# Patient Record
Sex: Female | Born: 1950 | Race: White | Hispanic: No | Marital: Married | Smoking: Current every day smoker
Health system: Southern US, Community
[De-identification: ages and names within clinical notes are randomized; demographics above are authoritative.]

## PROBLEM LIST (undated history)

## (undated) DIAGNOSIS — M8580 Other specified disorders of bone density and structure, unspecified site: Secondary | ICD-10-CM

## (undated) DIAGNOSIS — E039 Hypothyroidism, unspecified: Secondary | ICD-10-CM

## (undated) DIAGNOSIS — E785 Hyperlipidemia, unspecified: Secondary | ICD-10-CM

## (undated) DIAGNOSIS — J449 Chronic obstructive pulmonary disease, unspecified: Secondary | ICD-10-CM

## (undated) DIAGNOSIS — H269 Unspecified cataract: Secondary | ICD-10-CM

## (undated) DIAGNOSIS — Z86010 Personal history of colonic polyps: Secondary | ICD-10-CM

## (undated) HISTORY — DX: Other specified disorders of bone density and structure, unspecified site: M85.80

## (undated) HISTORY — DX: Hyperlipidemia, unspecified: E78.5

## (undated) HISTORY — DX: Hypothyroidism, unspecified: E03.9

## (undated) HISTORY — DX: Chronic obstructive pulmonary disease, unspecified: J44.9

## (undated) HISTORY — DX: Personal history of colonic polyps: Z86.010

## (undated) HISTORY — PX: WISDOM TOOTH EXTRACTION: SHX21

## (undated) HISTORY — DX: Unspecified cataract: H26.9

---

## 1972-02-11 HISTORY — PX: APPENDECTOMY: SHX54

## 1974-02-10 HISTORY — PX: BREAST CYST EXCISION: SHX579

## 1998-12-25 ENCOUNTER — Encounter: Admission: RE | Admit: 1998-12-25 | Discharge: 1998-12-25 | Payer: Self-pay | Admitting: Family Medicine

## 1998-12-25 ENCOUNTER — Encounter: Payer: Self-pay | Admitting: Family Medicine

## 1999-01-16 ENCOUNTER — Other Ambulatory Visit: Admission: RE | Admit: 1999-01-16 | Discharge: 1999-01-16 | Payer: Self-pay | Admitting: Obstetrics and Gynecology

## 2000-03-13 ENCOUNTER — Ambulatory Visit (HOSPITAL_COMMUNITY): Admission: RE | Admit: 2000-03-13 | Discharge: 2000-03-13 | Payer: Self-pay | Admitting: Internal Medicine

## 2000-03-13 ENCOUNTER — Encounter: Payer: Self-pay | Admitting: Internal Medicine

## 2000-03-17 ENCOUNTER — Other Ambulatory Visit: Admission: RE | Admit: 2000-03-17 | Discharge: 2000-03-17 | Payer: Self-pay | Admitting: Obstetrics and Gynecology

## 2000-06-17 ENCOUNTER — Encounter: Admission: RE | Admit: 2000-06-17 | Discharge: 2000-06-17 | Payer: Self-pay | Admitting: Obstetrics and Gynecology

## 2000-06-17 ENCOUNTER — Encounter: Payer: Self-pay | Admitting: Obstetrics and Gynecology

## 2001-10-20 ENCOUNTER — Other Ambulatory Visit: Admission: RE | Admit: 2001-10-20 | Discharge: 2001-10-20 | Payer: Self-pay | Admitting: Obstetrics and Gynecology

## 2002-02-10 DIAGNOSIS — E039 Hypothyroidism, unspecified: Secondary | ICD-10-CM

## 2002-02-10 HISTORY — DX: Hypothyroidism, unspecified: E03.9

## 2002-04-18 ENCOUNTER — Ambulatory Visit (HOSPITAL_COMMUNITY): Admission: RE | Admit: 2002-04-18 | Discharge: 2002-04-18 | Payer: Self-pay | Admitting: Gastroenterology

## 2003-01-11 ENCOUNTER — Other Ambulatory Visit: Admission: RE | Admit: 2003-01-11 | Discharge: 2003-01-11 | Payer: Self-pay | Admitting: Obstetrics and Gynecology

## 2003-07-20 ENCOUNTER — Encounter: Admission: RE | Admit: 2003-07-20 | Discharge: 2003-07-20 | Payer: Self-pay | Admitting: Obstetrics and Gynecology

## 2004-03-14 ENCOUNTER — Ambulatory Visit: Payer: Self-pay | Admitting: Endocrinology

## 2004-04-11 ENCOUNTER — Ambulatory Visit: Payer: Self-pay | Admitting: Endocrinology

## 2004-06-17 ENCOUNTER — Ambulatory Visit: Payer: Self-pay | Admitting: Endocrinology

## 2004-07-31 ENCOUNTER — Other Ambulatory Visit: Admission: RE | Admit: 2004-07-31 | Discharge: 2004-07-31 | Payer: Self-pay | Admitting: Obstetrics and Gynecology

## 2005-05-14 ENCOUNTER — Encounter: Admission: RE | Admit: 2005-05-14 | Discharge: 2005-05-14 | Payer: Self-pay | Admitting: Obstetrics and Gynecology

## 2005-09-26 ENCOUNTER — Other Ambulatory Visit: Admission: RE | Admit: 2005-09-26 | Discharge: 2005-09-26 | Payer: Self-pay | Admitting: Obstetrics and Gynecology

## 2006-05-27 ENCOUNTER — Encounter: Admission: RE | Admit: 2006-05-27 | Discharge: 2006-05-27 | Payer: Self-pay | Admitting: Obstetrics and Gynecology

## 2007-05-28 ENCOUNTER — Encounter: Admission: RE | Admit: 2007-05-28 | Discharge: 2007-05-28 | Payer: Self-pay | Admitting: Obstetrics and Gynecology

## 2008-06-05 ENCOUNTER — Encounter: Admission: RE | Admit: 2008-06-05 | Discharge: 2008-06-05 | Payer: Self-pay | Admitting: Obstetrics and Gynecology

## 2009-06-07 ENCOUNTER — Encounter: Admission: RE | Admit: 2009-06-07 | Discharge: 2009-06-07 | Payer: Self-pay | Admitting: Obstetrics and Gynecology

## 2010-06-28 NOTE — Op Note (Signed)
NAME:  Mindy Jones, Mindy Jones                         ACCOUNT NO.:  [id number]   MEDICAL RECORD NO.:  [id number]                   PATIENT TYPE:  AMB   LOCATION:  ENDO                                 FACILITY:  [facility]   PHYSICIAN:  Anselmo Rod, M.D.               DATE OF BIRTH:  [birth year]   DATE OF PROCEDURE:  04/18/2002  DATE OF DISCHARGE:                                 OPERATIVE REPORT   PROCEDURE:  Screening colonoscopy.   ENDOSCOPIST:  Anselmo Rod, M.D.   INSTRUMENT USED:  Pediatric adjustable Olympus colonoscope.   INDICATION FOR PROCEDURE:  A 60 year old white female with a family history  of colon cancer in maternal grandmother and change in bowel habit,  undergoing screening colonoscopy.  Rule out colonic polyps, masses, etc.   PREPROCEDURE PREPARATION:  Informed consent was procured from the patient.  The patient had fasted for eight hours prior to the procedure and prepped  with a bottle of Miralax and Gatorade the night prior to the procedure.   PREPROCEDURE PHYSICAL:  VITAL SIGNS:  The patient had stable vital signs.  NECK:  Supple.  CHEST:  Clear to auscultation.  S1, S2 regular.  ABDOMEN:  Soft with normal bowel sounds.   DESCRIPTION OF PROCEDURE:  The patient was placed in the left lateral  decubitus position and sedated with 70 mg of Demerol and 6 mg of Versed  intravenously.  Once the patient was adequately sedate and maintained on low-  flow oxygen and continuous cardiac monitoring, the Olympus video colonoscope  was advanced from the rectum to the cecum with slight difficulty.  Abdominal  pressure was applied to facilitate the entry of the scope into the cecum.  The appendiceal orifice and the ileocecal valve were clearly visualized and  photographed.  No masses, polyps, erosions, or ulcerations were seen.  There  was no evidence of diverticulosis.  Small, nonbleeding internal hemorrhoids  were seen on retroflexion in the rectum.  The patient tolerated  the  procedure well without complication.   IMPRESSION:  1. Normal colonoscopy up to the cecum except for small, nonbleeding internal     hemorrhoid.  2. Normal-appearing terminal ileum.  3. No masses, polyps, or diverticulosis noted.   RECOMMENDATIONS:  1. A high-fiber diet with liberal fluid intake has been advocated.  2.     A repeat colorectal cancer screening is recommended in the next five years     unless the patient develops any abnormal symptoms in the interim.  3. Outpatient follow-up in the next two weeks for further recommendations.                                               Anselmo Rod, M.D.    JNM/MEDQ  D:  04/18/2002  T:  04/18/2002  Job:  [id number]   cc:   Dois Davenport A. Rivard, M.D.  [address]  [place]  [place] [address]  Fax: [phone]

## 2010-07-02 ENCOUNTER — Other Ambulatory Visit: Payer: Self-pay | Admitting: Obstetrics and Gynecology

## 2010-07-02 DIAGNOSIS — Z1231 Encounter for screening mammogram for malignant neoplasm of breast: Secondary | ICD-10-CM

## 2010-07-05 ENCOUNTER — Ambulatory Visit
Admission: RE | Admit: 2010-07-05 | Discharge: 2010-07-05 | Disposition: A | Discharge status: Home / Self Care | Payer: BC Managed Care – PPO | Source: Ambulatory Visit | Attending: Obstetrics and Gynecology | Admitting: Obstetrics and Gynecology

## 2010-07-05 DIAGNOSIS — Z1231 Encounter for screening mammogram for malignant neoplasm of breast: Secondary | ICD-10-CM

## 2011-03-27 ENCOUNTER — Ambulatory Visit
Admission: RE | Admit: 2011-03-27 | Discharge: 2011-03-27 | Disposition: A | Discharge status: Home / Self Care | Payer: BC Managed Care – PPO | Source: Ambulatory Visit | Attending: Family Medicine | Admitting: Family Medicine

## 2011-03-27 ENCOUNTER — Other Ambulatory Visit: Payer: Self-pay | Admitting: Family Medicine

## 2011-03-27 DIAGNOSIS — M79602 Pain in left arm: Secondary | ICD-10-CM

## 2011-04-30 ENCOUNTER — Ambulatory Visit: Payer: Self-pay | Admitting: Obstetrics and Gynecology

## 2011-06-19 DIAGNOSIS — E0500 Thyrotoxicosis with diffuse goiter without thyrotoxic crisis or storm: Secondary | ICD-10-CM | POA: Insufficient documentation

## 2011-06-19 DIAGNOSIS — M8580 Other specified disorders of bone density and structure, unspecified site: Secondary | ICD-10-CM | POA: Insufficient documentation

## 2011-06-26 ENCOUNTER — Ambulatory Visit: Payer: Self-pay | Admitting: Obstetrics and Gynecology

## 2011-07-02 ENCOUNTER — Encounter: Payer: Self-pay | Admitting: Obstetrics and Gynecology

## 2011-07-02 ENCOUNTER — Ambulatory Visit (INDEPENDENT_AMBULATORY_CARE_PROVIDER_SITE_OTHER): Payer: BC Managed Care – PPO | Admitting: Obstetrics and Gynecology

## 2011-07-02 VITALS — BP 108/60 | Resp 16 | Ht 65.0 in | Wt 173.0 lb

## 2011-07-02 DIAGNOSIS — Z01419 Encounter for gynecological examination (general) (routine) without abnormal findings: Secondary | ICD-10-CM

## 2011-07-02 DIAGNOSIS — Z124 Encounter for screening for malignant neoplasm of cervix: Secondary | ICD-10-CM

## 2011-07-02 NOTE — Progress Notes (Signed)
The patient is not taking hormone replacement therapy The patient  is not taking a Calcium supplement. Post-menopausal bleeding:no  Last Pap: approximate date 04/18/2010 and was normal March  2012 Last mammogram: approximate date 07/05/2010 and was normal May  2012 Last DEXA scan : T= -1.7     04/18/2010 Last colonoscopy:was abnormal:  Hemorrhoids, Polyps April 2009 Last TSH 1 year ago and normal. Seeing PCP next week.  Urinary symptoms: none Normal bowel movements: No:  Reports abuse at home: No:   Subjective:    Mindy Jones is a 61 y.o. female G4P2 who presents for annual exam.  The patient has no complaints today.   The following portions of the patient's history were reviewed and updated as appropriate: allergies, current medications, past family history, past medical history, past social history, past surgical history and problem list.  Review of Systems Pertinent items are noted in HPI. Gastrointestinal:No change in bowel habits, no abdominal pain, no rectal bleeding Genitourinary:negative for dysuria, frequency, hematuria, nocturia and urinary incontinence    Objective:     BP 108/60  Resp 16  Ht 5\' 5"  (1.651 m)  Wt 173 lb (78.472 kg)  BMI 28.79 kg/m2  Weight:  Wt Readings from Last 1 Encounters:  07/02/11 173 lb (78.472 kg)     BMI: Body mass index is 28.79 kg/(m^2). General Appearance: Alert, appropriate appearance for age. No acute distress HEENT: Grossly normal Neck / Thyroid: Supple, no masses, nodes or enlargement Lungs: clear to auscultation bilaterally Back: No CVA tenderness Breast Exam: No masses or nodes.No dimpling, nipple retraction or discharge. Cardiovascular: Regular rate and rhythm. S1, S2, no murmur Gastrointestinal: Soft, non-tender, no masses or organomegaly Pelvic Exam: Vulva and vagina appear normal. Bimanual exam reveals normal uterus and adnexa. Rectovaginal: normal rectal, no masses Lymphatic Exam: Non-palpable nodes in neck, clavicular,  axillary, or inguinal regions Skin: no rash or abnormalities Neurologic: Normal gait and speech, no tremor  Psychiatric: Alert and oriented, appropriate affect.    Urinalysis:Not done      Assessment:    Normal gyn exam    Plan:    Mammogram. Pap smear.   Follow-up:  for annual exam

## 2011-10-21 ENCOUNTER — Other Ambulatory Visit: Payer: Self-pay | Admitting: Obstetrics and Gynecology

## 2011-10-21 DIAGNOSIS — Z1231 Encounter for screening mammogram for malignant neoplasm of breast: Secondary | ICD-10-CM

## 2011-11-03 ENCOUNTER — Ambulatory Visit
Admission: RE | Admit: 2011-11-03 | Discharge: 2011-11-03 | Disposition: A | Discharge status: Home / Self Care | Payer: BC Managed Care – PPO | Source: Ambulatory Visit | Attending: Obstetrics and Gynecology | Admitting: Obstetrics and Gynecology

## 2011-11-03 DIAGNOSIS — Z1231 Encounter for screening mammogram for malignant neoplasm of breast: Secondary | ICD-10-CM

## 2012-06-23 ENCOUNTER — Other Ambulatory Visit: Payer: Self-pay | Admitting: Family Medicine

## 2012-06-23 DIAGNOSIS — M8580 Other specified disorders of bone density and structure, unspecified site: Secondary | ICD-10-CM

## 2012-07-01 ENCOUNTER — Ambulatory Visit
Admission: RE | Admit: 2012-07-01 | Discharge: 2012-07-01 | Disposition: A | Discharge status: Home / Self Care | Payer: BC Managed Care – PPO | Source: Ambulatory Visit | Attending: Family Medicine | Admitting: Family Medicine

## 2012-07-01 DIAGNOSIS — M8580 Other specified disorders of bone density and structure, unspecified site: Secondary | ICD-10-CM

## 2012-12-31 ENCOUNTER — Other Ambulatory Visit: Payer: Self-pay | Admitting: Family Medicine

## 2012-12-31 DIAGNOSIS — F17200 Nicotine dependence, unspecified, uncomplicated: Secondary | ICD-10-CM

## 2012-12-31 DIAGNOSIS — Z1231 Encounter for screening mammogram for malignant neoplasm of breast: Secondary | ICD-10-CM

## 2013-01-05 IMAGING — CR DG CERVICAL SPINE COMPLETE 4+V
5 series · 5 of 5 positions shown · non-contrast
Comparison: None.

CLINICAL DATA: Left arm and hand pain.  Limited range of motion of
the neck.

CERVICAL SPINE - COMPLETE 4+ VIEW

[view not recorded (1 of 5)]
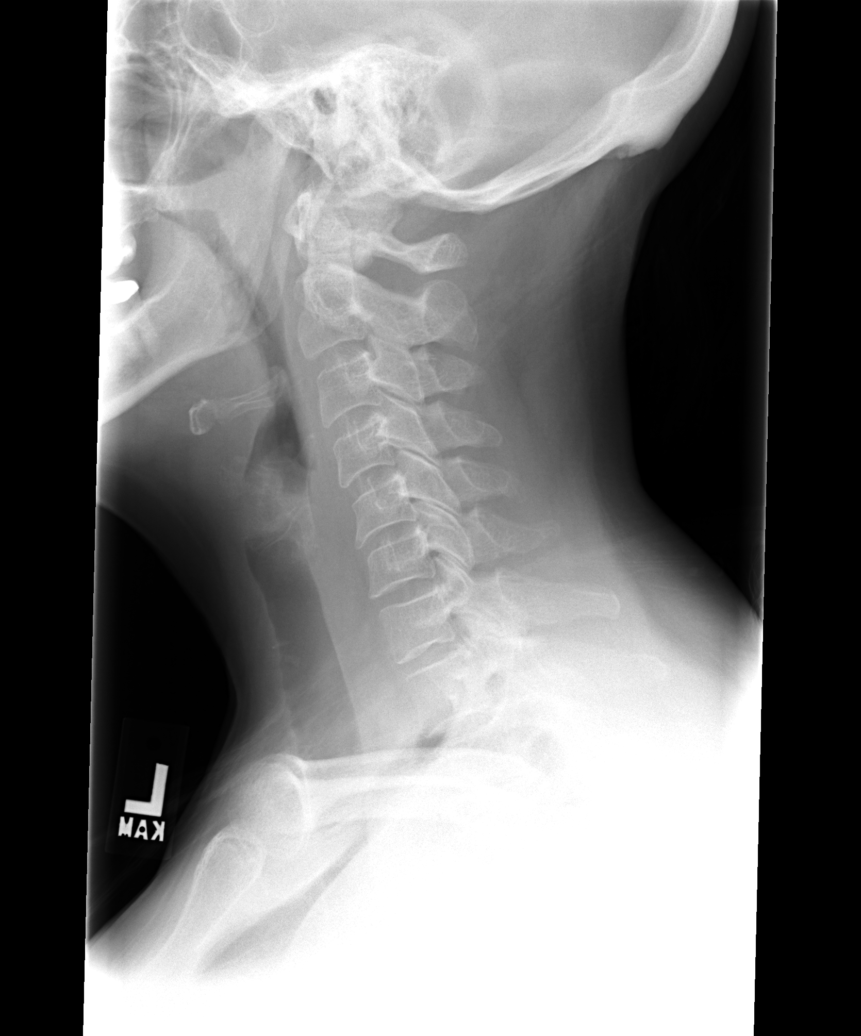

[view not recorded (2 of 5)]
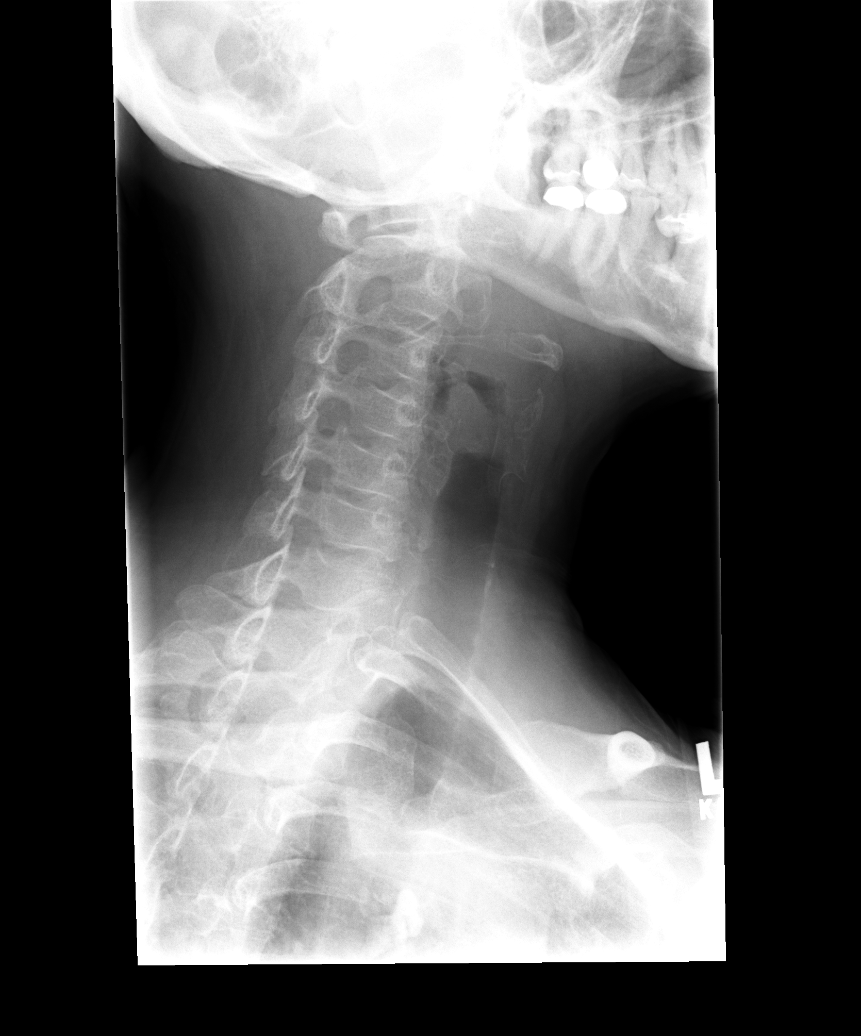

[view not recorded (3 of 5)]
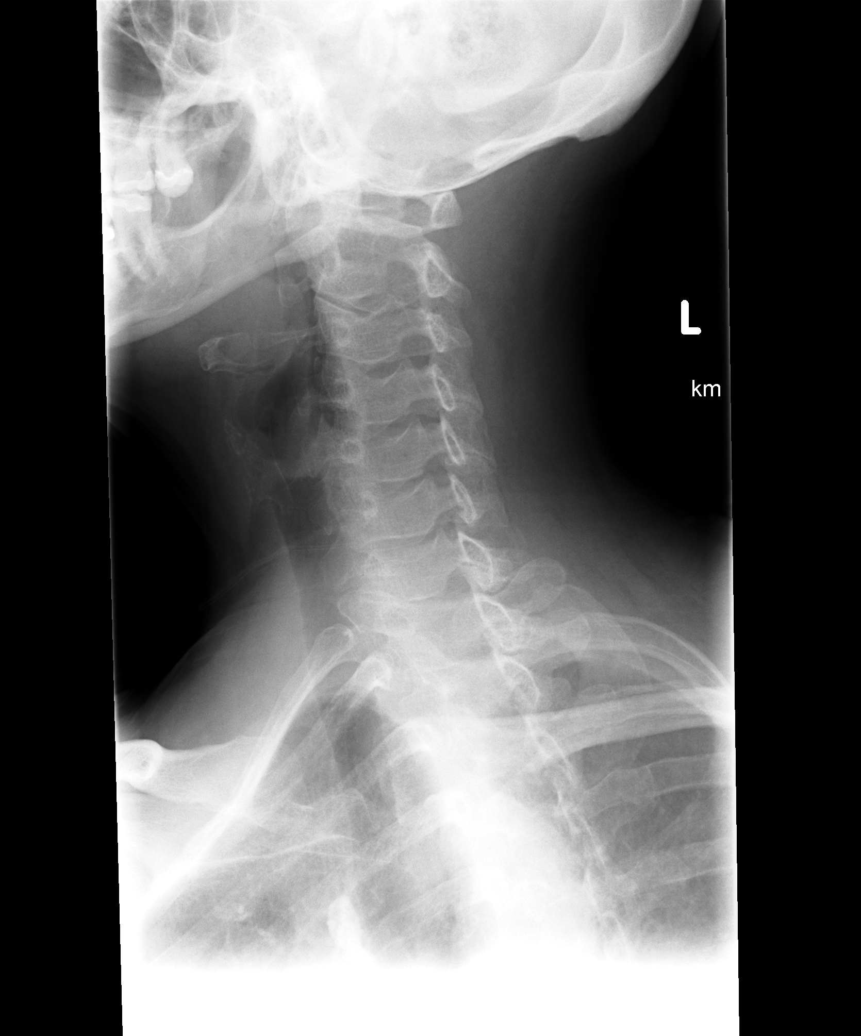

[view not recorded (4 of 5)]
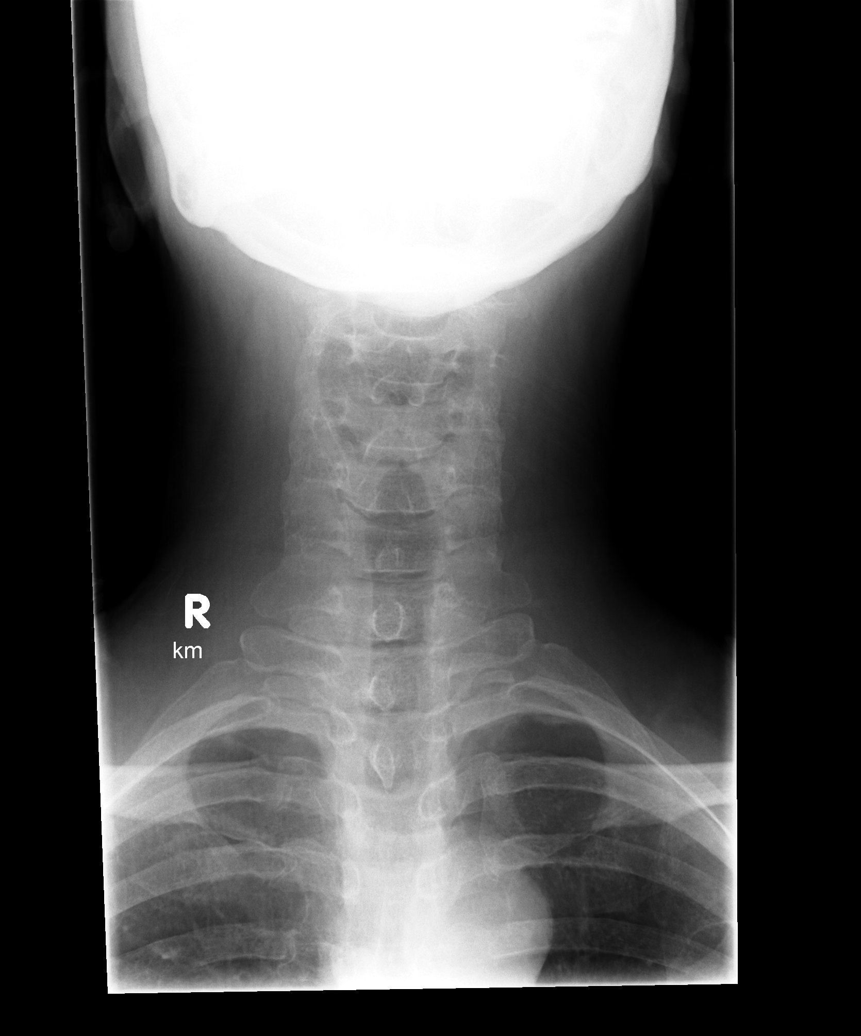

[view not recorded (5 of 5)]
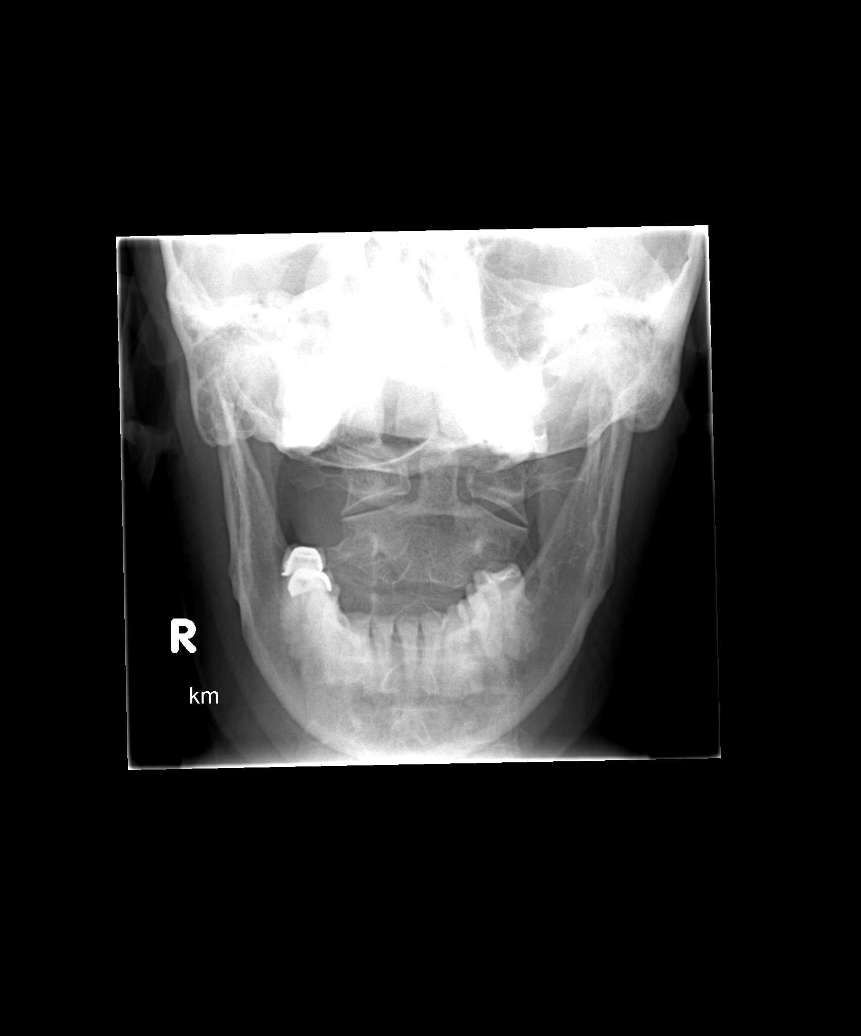

[5 of 5 positions shown; findings below may reference images not displayed]

FINDINGS: There is no abnormality of alignment or position.  Disc
spaces are well preserved.  Neural foramina are widely patent.  No
facet arthritis.  No prevertebral soft tissue swelling.
IMPRESSION: Normal cervical spine.

## 2013-01-10 ENCOUNTER — Inpatient Hospital Stay: Admission: RE | Admit: 2013-01-10 | Payer: BC Managed Care – PPO | Source: Ambulatory Visit

## 2013-02-14 ENCOUNTER — Ambulatory Visit: Payer: BC Managed Care – PPO

## 2013-03-10 ENCOUNTER — Encounter: Payer: BC Managed Care – PPO | Admitting: Internal Medicine

## 2013-03-10 ENCOUNTER — Encounter: Payer: Self-pay | Admitting: Internal Medicine

## 2013-03-10 NOTE — Progress Notes (Signed)
Patient ID: Mindy Jones, female   DOB: 1950-06-21, 63 y.o.   MRN: [id number] Notified Dr. Derinda Late at [phone] that patient canceled her colonoscopy appointment for today.  Did not R/S.  They had referred her to [place].

## 2013-12-12 ENCOUNTER — Encounter: Payer: Self-pay | Admitting: Obstetrics and Gynecology

## 2014-03-02 ENCOUNTER — Ambulatory Visit
Admission: RE | Admit: 2014-03-02 | Discharge: 2014-03-02 | Disposition: A | Discharge status: Home / Self Care | Payer: BC Managed Care – PPO | Source: Ambulatory Visit | Attending: Family Medicine | Admitting: Family Medicine

## 2014-03-02 ENCOUNTER — Other Ambulatory Visit: Payer: Self-pay | Admitting: Family Medicine

## 2014-03-02 DIAGNOSIS — M79602 Pain in left arm: Secondary | ICD-10-CM

## 2014-03-17 ENCOUNTER — Telehealth: Payer: Self-pay | Admitting: Internal Medicine

## 2014-03-27 ENCOUNTER — Encounter: Payer: Self-pay | Admitting: Internal Medicine

## 2014-04-28 NOTE — Telephone Encounter (Signed)
Pt scheduled  

## 2014-05-12 HISTORY — PX: COLONOSCOPY: SHX174

## 2014-05-18 ENCOUNTER — Ambulatory Visit (AMBULATORY_SURGERY_CENTER): Payer: Self-pay | Admitting: *Deleted

## 2014-05-18 VITALS — Ht 65.0 in | Wt 181.6 lb

## 2014-05-18 DIAGNOSIS — Z1211 Encounter for screening for malignant neoplasm of colon: Secondary | ICD-10-CM

## 2014-05-18 NOTE — Progress Notes (Signed)
No egg or soy allergy  No anesthesia or intubation problems per pt  No diet medications taken   

## 2014-05-23 ENCOUNTER — Other Ambulatory Visit: Payer: Self-pay | Admitting: Family Medicine

## 2014-05-23 ENCOUNTER — Ambulatory Visit
Admission: RE | Admit: 2014-05-23 | Discharge: 2014-05-23 | Disposition: A | Discharge status: Home / Self Care | Payer: BC Managed Care – PPO | Source: Ambulatory Visit | Attending: Family Medicine | Admitting: Family Medicine

## 2014-05-23 DIAGNOSIS — M533 Sacrococcygeal disorders, not elsewhere classified: Principal | ICD-10-CM

## 2014-05-23 DIAGNOSIS — G8929 Other chronic pain: Secondary | ICD-10-CM

## 2014-05-31 ENCOUNTER — Encounter: Payer: Self-pay | Admitting: Internal Medicine

## 2014-06-01 ENCOUNTER — Encounter: Payer: Self-pay | Admitting: Internal Medicine

## 2014-06-01 ENCOUNTER — Ambulatory Visit (AMBULATORY_SURGERY_CENTER): Payer: BC Managed Care – PPO | Admitting: Internal Medicine

## 2014-06-01 VITALS — BP 119/46 | HR 53 | Temp 98.1°F | Resp 19 | Ht 65.0 in | Wt 181.0 lb

## 2014-06-01 DIAGNOSIS — D125 Benign neoplasm of sigmoid colon: Secondary | ICD-10-CM

## 2014-06-01 DIAGNOSIS — Z1211 Encounter for screening for malignant neoplasm of colon: Secondary | ICD-10-CM | POA: Diagnosis present

## 2014-06-01 DIAGNOSIS — D123 Benign neoplasm of transverse colon: Secondary | ICD-10-CM | POA: Diagnosis not present

## 2014-06-01 DIAGNOSIS — D124 Benign neoplasm of descending colon: Secondary | ICD-10-CM | POA: Diagnosis not present

## 2014-06-01 MED ORDER — SODIUM CHLORIDE 0.9 % IV SOLN: 500.0000 mL | INTRAVENOUS | Status: DC

## 2014-06-01 NOTE — Patient Instructions (Addendum)
I found and removed 3 very small polyps that look benign. You also have a condition called diverticulosis - common and not usually a problem. Please read the handout provided. Hemorrhoids were also seen.  If you have hemorrhoid problems (swelling, itching, bleeding) I am able to treat those with an in-office procedure. If you like, please call my office at [phone] to schedule an appointment and I can evaluate you further.  I will let you know pathology results and when to have another routine colonoscopy by mail.  I appreciate the opportunity to care for you. Gatha Mayer, MD, FACG  YOU HAD AN ENDOSCOPIC PROCEDURE TODAY AT [facility]:   Refer to the procedure report that was given to you for any specific questions about what was found during the examination.  If the procedure report does not answer your questions, please call your gastroenterologist to clarify.  If you requested that your care partner not be given the details of your procedure findings, then the procedure report has been included in a sealed envelope for you to review at your convenience later.  YOU SHOULD EXPECT: Some feelings of bloating in the abdomen. Passage of more gas than usual.  Walking can help get rid of the air that was put into your GI tract during the procedure and reduce the bloating. If you had a lower endoscopy (such as a colonoscopy or flexible sigmoidoscopy) you may notice spotting of blood in your stool or on the toilet paper. If you underwent a bowel prep for your procedure, you may not have a normal bowel movement for a few days.  Please Note:  You might notice some irritation and congestion in your nose or some drainage.  This is from the oxygen used during your procedure.  There is no need for concern and it should clear up in a day or so.  SYMPTOMS TO REPORT IMMEDIATELY:   Following lower endoscopy (colonoscopy or flexible sigmoidoscopy):  Excessive amounts of blood in the  stool  Significant tenderness or worsening of abdominal pains  Swelling of the abdomen that is new, acute  Fever of 100F or higher    For urgent or emergent issues, a gastroenterologist can be reached at any hour by calling [phone].   DIET: Your first meal following the procedure should be a small meal and then it is ok to progress to your normal diet. Heavy or fried foods are harder to digest and may make you feel nauseous or bloated.  Likewise, meals heavy in dairy and vegetables can increase bloating.  Drink plenty of fluids but you should avoid alcoholic beverages for 24 hours.  ACTIVITY:  You should plan to take it easy for the rest of today and you should NOT DRIVE or use heavy machinery until tomorrow (because of the sedation medicines used during the test).    FOLLOW UP: Our staff will call the number listed on your records the next business day following your procedure to check on you and address any questions or concerns that you may have regarding the information given to you following your procedure. If we do not reach you, we will leave a message.  However, if you are feeling well and you are not experiencing any problems, there is no need to return our call.  We will assume that you have returned to your regular daily activities without incident.  If any biopsies were taken you will be contacted by phone or by letter within the  next 1-3 weeks.  Please call us at [phone] if you have not heard about the biopsies in 3 weeks.    SIGNATURES/CONFIDENTIALITY: You and/or your care partner have signed paperwork which will be entered into your electronic medical record.  These signatures attest to the fact that that the information above on your After Visit Summary has been reviewed and is understood.  Full responsibility of the confidentiality of this discharge information lies with you and/or your care-partner.  Polyp, diverticulosis, and hemorhoid information given.

## 2014-06-01 NOTE — Op Note (Signed)
[place]  [address] [place], [id number]   COLONOSCOPY PROCEDURE REPORT  PATIENT: Mindy, Jones  MR#: [id number] BIRTHDATE: Dec 09, 1950 , 63  yrs. old GENDER: female ENDOSCOPIST: Gatha Mayer, MD, [facility] PROCEDURE DATE:  06/01/2014 PROCEDURE:   Colonoscopy, screening, Colonoscopy with biopsy, and Colonoscopy with snare polypectomy First Screening Colonoscopy - Avg.  risk and is 50 yrs.  old or older - No.  Prior Negative Screening - Now for repeat screening. 10 or more years since last screening  History of Adenoma - Now for follow-up colonoscopy & has been > or = to 3 yrs.  N/A ASA CLASS:   Class II INDICATIONS:Screening for colonic neoplasia and Colorectal Neoplasm Risk Assessment for this procedure is average risk. MEDICATIONS: Propofol 200 mg IV and Monitored anesthesia care  DESCRIPTION OF PROCEDURE:   After the risks benefits and alternatives of the procedure were thoroughly explained, informed consent was obtained.  The digital rectal exam revealed no abnormalities of the rectum.   The [id number]  endoscope was introduced through the anus and advanced to the cecum, which was identified by both the appendix and ileocecal valve. No adverse events experienced.   The quality of the prep was good.  (MiraLax was used)  The instrument was then slowly withdrawn as the colon was fully examined.      COLON FINDINGS: Three sessile polyps ranging from 2 to 9mm in size were found in the transverse colon, descending colon, and sigmoid colon.  Polypectomies were performed with a cold snare and with cold forceps.  The resection was complete, the polyp tissue was completely retrieved and sent to histology.   There was mild diverticulosis noted in the sigmoid colon.   External and internal hemorrhoids were found.   The examination was otherwise normal. Retroflexed views revealed internal hemorrhoids and Retroflexed views revealed external hemorrhoids.  The time to cecum = 6.0 Withdrawal time = 10.7   The scope was withdrawn and the procedure completed. COMPLICATIONS: There were no immediate complications.    ENDOSCOPIC IMPRESSION: 1.   Three sessile polyps ranging from 2 to 65mm in size were found in the transverse colon, descending colon, and sigmoid colon; polypectomies were performed with a cold snare and with cold forceps 2.   Mild diverticulosis was noted in the sigmoid colon 3.   External and internal hemorrhoids 4.   The examination was otherwise normal - good prep - second screening  RECOMMENDATIONS: 1.  Timing of repeat colonoscopy will be determined by pathology findings. 2.  Hemorrhoid banding is an option if they are symptomatic  eSigned:  Gatha Mayer, MD, [facility] 06/01/2014 11:32 AM   cc: Derinda Late, MD and The Patient   PATIENT NAME:  Mindy Jones, Mindy Jones MR#: [id number]

## 2014-06-01 NOTE — Progress Notes (Signed)
Called to room to assist during endoscopic procedure.  Patient ID and intended procedure confirmed with present staff. Received instructions for my participation in the procedure from the performing physician.  

## 2014-06-01 NOTE — Progress Notes (Signed)
Report to PACU, RN, vss, BBS= Clear.  

## 2014-06-02 ENCOUNTER — Telehealth: Payer: Self-pay | Admitting: *Deleted

## 2014-06-02 NOTE — Telephone Encounter (Signed)
  Follow up Call-  Call back number 06/01/2014  Post procedure Call Back phone  # [phone]  Permission to leave phone message Yes    [facility]

## 2014-06-11 ENCOUNTER — Encounter: Payer: Self-pay | Admitting: Internal Medicine

## 2014-06-11 DIAGNOSIS — Z86010 Personal history of colonic polyps: Secondary | ICD-10-CM

## 2014-06-11 HISTORY — DX: Personal history of colonic polyps: Z86.010

## 2014-06-11 NOTE — Progress Notes (Signed)
Quick Note:  Diminutive adenoma, a hyperplastic and lymphoid polyp so repeat colonoscopy in about 5 yrs 2021 ______

## 2014-10-02 ENCOUNTER — Other Ambulatory Visit: Payer: Self-pay | Admitting: Family Medicine

## 2014-10-02 ENCOUNTER — Ambulatory Visit
Admission: RE | Admit: 2014-10-02 | Discharge: 2014-10-02 | Disposition: A | Discharge status: Home / Self Care | Payer: BC Managed Care – PPO | Source: Ambulatory Visit | Attending: Family Medicine | Admitting: Family Medicine

## 2014-10-02 DIAGNOSIS — R059 Cough, unspecified: Secondary | ICD-10-CM

## 2014-10-02 DIAGNOSIS — R05 Cough: Secondary | ICD-10-CM

## 2014-10-02 DIAGNOSIS — R509 Fever, unspecified: Secondary | ICD-10-CM

## 2019-05-01 ENCOUNTER — Encounter: Payer: Self-pay | Admitting: Internal Medicine

## 2021-07-02 ENCOUNTER — Encounter: Payer: Self-pay | Admitting: Internal Medicine
